# Patient Record
Sex: Female | Born: 1990 | Race: White | Hispanic: No | Marital: Single | Smoking: Former smoker
Health system: Southern US, Community
[De-identification: ages and names within clinical notes are randomized; demographics above are authoritative.]

## PROBLEM LIST (undated history)

## (undated) HISTORY — PX: APPENDECTOMY: SHX54

---

## 2013-12-10 ENCOUNTER — Emergency Department
Admission: EM | Admit: 2013-12-10 | Discharge: 2013-12-10 | Disposition: A | Discharge status: Home / Self Care | Payer: Self-pay | Source: Home / Self Care | Attending: Family Medicine | Admitting: Family Medicine

## 2013-12-10 ENCOUNTER — Emergency Department (INDEPENDENT_AMBULATORY_CARE_PROVIDER_SITE_OTHER): Payer: Self-pay

## 2013-12-10 ENCOUNTER — Encounter: Payer: Self-pay | Admitting: Emergency Medicine

## 2013-12-10 DIAGNOSIS — S335XXA Sprain of ligaments of lumbar spine, initial encounter: Secondary | ICD-10-CM

## 2013-12-10 DIAGNOSIS — S39012A Strain of muscle, fascia and tendon of lower back, initial encounter: Secondary | ICD-10-CM

## 2013-12-10 DIAGNOSIS — M545 Low back pain, unspecified: Secondary | ICD-10-CM

## 2013-12-10 LAB — POCT URINALYSIS DIP (MANUAL ENTRY)
Bilirubin, UA: NEGATIVE
GLUCOSE UA: NEGATIVE
Ketones, POC UA: NEGATIVE
Leukocytes, UA: NEGATIVE
Nitrite, UA: NEGATIVE
Protein Ur, POC: NEGATIVE
SPEC GRAV UA: 1.025 (ref 1.005–1.03)
UROBILINOGEN UA: 0.2 (ref 0–1)
pH, UA: 6 (ref 5–8)

## 2013-12-10 LAB — POCT URINE PREGNANCY: Preg Test, Ur: NEGATIVE

## 2013-12-10 MED ORDER — MELOXICAM 15 MG PO TABS: 15.0000 mg | ORAL_TABLET | Freq: Every day | ORAL | Status: DC

## 2013-12-10 MED ORDER — KETOROLAC TROMETHAMINE 30 MG/ML IJ SOLN
30.0000 mg | Freq: Once | INTRAMUSCULAR | Status: AC
  Administered 2013-12-10: 30 mg via INTRAMUSCULAR

## 2013-12-10 MED ORDER — CYCLOBENZAPRINE HCL 10 MG PO TABS: 10.0000 mg | ORAL_TABLET | Freq: Three times a day (TID) | ORAL | Status: DC | PRN

## 2013-12-10 NOTE — ED Notes (Signed)
Reports onset of left lateral mid thoracic pain x 1 week ago; has started new job in past month that does involve lifting things over her head. Has taken tylenol for discomfort.

## 2013-12-10 NOTE — ED Provider Notes (Addendum)
CSN: [id number]     Arrival date & time 12/10/13  [id number] History   First MD Initiated Contact with Patient 12/10/13 0935     Chief Complaint  Patient presents with  . Back Pain    HPI  The patient presents today with back pain. Location: R lumbar region  Timing: 1 week Description: L lumbar back pain worse with movement. Has some spasms with laying flat. No dysuria, hematuria, increased urinary frequency. 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 Has done more of this over last 1-2 weeks. Pain exacerbated by these movements. On last day on LMP.    Worse with: movement  Better with: rest  Trauma: no Bladder/bowel incontinence: no Weakness: no Fever/chills: no Night pain:no Unexplained weight loss: no Cancer/immunosuppression: no PMH of osteoporosis or chronic steroid use:  no   No past medical history on file. No past surgical history on file. No family history on file. History  Substance Use Topics  . Smoking status: Not on file  . Smokeless tobacco: Not on file  . Alcohol Use: Not on file   OB History   No data available     Review of Systems  All other systems reviewed and are negative.   Allergies  Review of patient's allergies indicates not on file.  Home Medications  No current outpatient prescriptions on file. There were no vitals taken for this visit. Physical Exam  Constitutional: She appears well-developed and well-nourished.  HENT:  Head: Normocephalic and atraumatic.  Eyes: Conjunctivae are normal. Pupils are equal, round, and reactive to light.  Neck: Normal range of motion. Neck supple.  Cardiovascular: Normal rate and regular rhythm.   Pulmonary/Chest: Effort normal and breath sounds normal.  Abdominal: Bowel sounds are normal.  Musculoskeletal:       Arms: + mild TTP over affected area Mild pain with FABER maneuver on L  Neurovascularly intact distally    Neurological: She is alert.  Skin: Skin is warm.    ED Course   Procedures (including critical care time) Labs Review Labs Reviewed - No data to display Imaging Review Dg Lumbar Spine Complete  12/10/2013   CLINICAL DATA:  Low back pain.  EXAM: LUMBAR SPINE - COMPLETE 4+ VIEW  COMPARISON:  None.  FINDINGS: There is no evidence of lumbar spine fracture. Alignment is normal. Intervertebral disc spaces are maintained.  IMPRESSION: Negative.   Electronically Signed   By: Amie Portlandavid  Ormond M.D.   On: 12/10/2013 10:36     MDM   1. Lumbar strain    Toradol 30mg  IMx1 Will place on course of mobic and flexeril  Xrays, UA, upreg WNL  Back pain handout  Discussed MSK red flags  Follow up as needed.     The patient and/or caregiver has been counseled thoroughly with regard to treatment plan and/or medications prescribed including dosage, schedule, interactions, rationale for use, and possible side effects and they verbalize understanding. Diagnoses and expected course of recovery discussed and will return if not improved as expected or if the condition worsens. Patient and/or caregiver verbalized understanding.          Doree AlbeeSteven Newton, MD 12/10/13 1039  Doree AlbeeSteven Newton, MD 12/10/13 1053

## 2014-02-22 ENCOUNTER — Emergency Department
Admission: EM | Admit: 2014-02-22 | Discharge: 2014-02-22 | Disposition: A | Discharge status: Home / Self Care | Payer: Self-pay | Source: Home / Self Care | Attending: Family Medicine | Admitting: Family Medicine

## 2014-02-22 ENCOUNTER — Emergency Department (INDEPENDENT_AMBULATORY_CARE_PROVIDER_SITE_OTHER): Payer: Self-pay

## 2014-02-22 ENCOUNTER — Encounter: Payer: Self-pay | Admitting: Emergency Medicine

## 2014-02-22 DIAGNOSIS — M25529 Pain in unspecified elbow: Secondary | ICD-10-CM

## 2014-02-22 DIAGNOSIS — S50811A Abrasion of right forearm, initial encounter: Secondary | ICD-10-CM

## 2014-02-22 DIAGNOSIS — S5000XA Contusion of unspecified elbow, initial encounter: Secondary | ICD-10-CM

## 2014-02-22 DIAGNOSIS — S5001XA Contusion of right elbow, initial encounter: Secondary | ICD-10-CM

## 2014-02-22 DIAGNOSIS — M25519 Pain in unspecified shoulder: Secondary | ICD-10-CM

## 2014-02-22 DIAGNOSIS — Z23 Encounter for immunization: Secondary | ICD-10-CM

## 2014-02-22 DIAGNOSIS — IMO0002 Reserved for concepts with insufficient information to code with codable children: Secondary | ICD-10-CM

## 2014-02-22 DIAGNOSIS — S43401A Unspecified sprain of right shoulder joint, initial encounter: Secondary | ICD-10-CM

## 2014-02-22 MED ORDER — MELOXICAM 15 MG PO TABS: ORAL_TABLET | ORAL | Status: DC

## 2014-02-22 MED ORDER — TETANUS-DIPHTH-ACELL PERTUSSIS 5-2.5-18.5 LF-MCG/0.5 IM SUSP
0.5000 mL | Freq: Once | INTRAMUSCULAR | Status: AC
  Administered 2014-02-22: 0.5 mL via INTRAMUSCULAR

## 2014-02-22 MED ORDER — CYCLOBENZAPRINE HCL 10 MG PO TABS: ORAL_TABLET | ORAL | Status: DC

## 2014-02-22 NOTE — ED Provider Notes (Signed)
CSN: [id number]     Arrival date & time 02/22/14  [id number] History   First MD Initiated Contact with Patient 02/22/14 [phone]     Chief Complaint  Patient presents with  . Elbow Injury      HPI Comments: Patient fell down stairs this morning, scraping her right forearm just distal to elbow, and injuring her right elbow and shoulder.  She has difficulty raising her right arm, and complains of pain in her right elbow.  Patient is a 23 y.o. female presenting with arm injury. The history is provided by the patient and a parent.  Arm Injury Location:  Elbow and shoulder Time since incident:  3 hours Injury: yes   Mechanism of injury: fall   Fall:    Fall occurred:  Down stairs   Impact surface:  Hard floor   Point of impact: right elbow. Shoulder location:  R shoulder Elbow location:  R elbow Pain details:    Quality:  Aching and sharp   Radiates to:  Does not radiate   Severity:  Moderate   Onset quality:  Sudden   Duration:  3 hours   Timing:  Constant   Progression:  Unchanged Chronicity:  New Handedness:  Right-handed Dislocation: no   Tetanus status:  Out of date Prior injury to area:  No Relieved by:  Nothing Worsened by:  Movement Ineffective treatments:  NSAIDs Associated symptoms: decreased range of motion, stiffness and swelling   Associated symptoms: no back pain, no fatigue, no muscle weakness, no neck pain, no numbness and no tingling     History reviewed. No pertinent past medical history. Past Surgical History  Procedure Laterality Date  . Appendectomy     Family History  Problem Relation Age of Onset  . Hypertension Mother   . Hyperlipidemia Mother    History  Substance Use Topics  . Smoking status: Former Smoker    Types: Cigarettes  . Smokeless tobacco: Not on file  . Alcohol Use: No   OB History   Grav Para Term Preterm Abortions TAB SAB Ect Mult Living                 Review of Systems  Constitutional: Negative for fatigue.  Musculoskeletal:  Positive for stiffness. Negative for back pain and neck pain.  All other systems reviewed and are negative.   Allergies  Review of patient's allergies indicates not on file.  Home Medications   Prior to Admission medications   Medication Sig Start Date End Date Taking? Authorizing Provider  cyclobenzaprine (FLEXERIL) 10 MG tablet Take one tab by mouth at bedtime 02/22/14   Lattie HawStephen A Beese, MD  meloxicam (MOBIC) 15 MG tablet Take one tab by mouth each evening with food 02/22/14   Lattie HawStephen A Beese, MD   BP 108/71  Pulse 63  Temp(Src) 98.1 F (36.7 C) (Oral)  Ht 5\' 2"  (1.575 m)  Wt 155 lb (70.308 kg)  BMI 28.34 kg/m2  SpO2 99%  LMP 01/30/2014 Physical Exam  Nursing note and vitals reviewed. Constitutional: She is oriented to person, place, and time. She appears well-developed and well-nourished. No distress.  HENT:  Head: Normocephalic and atraumatic.  Right Ear: External ear normal.  Left Ear: External ear normal.  Eyes: Conjunctivae are normal. Pupils are equal, round, and reactive to light.  Musculoskeletal:       Right shoulder: She exhibits decreased range of motion, tenderness, bony tenderness, pain and decreased strength. She exhibits no swelling, no effusion, no crepitus, no  deformity, no laceration and normal pulse.       Right elbow: She exhibits decreased range of motion. She exhibits no swelling, no effusion, no deformity and no laceration. Tenderness found. Radial head, medial epicondyle, lateral epicondyle and olecranon process tenderness noted.       Arms: Patient has mild tenderness over right AC joint without swelling.  She cannot actively abduct her right shoulder more than 30 degrees from verical.  Apleys' test positive.  Empty can test positive.  Decreased external rotation right shoulder.  Decreased strength right shoulder to internal/external rotation.  Distal neurovascular function is intact.   Right forearm just distal to the olecranon has a 2cm by 5cm  superficial abrasion  Right elbow has decreased range of motion:  Difficulty fully extending and fully flexing the right elbow.  Diffuse mild tenderness right elbow, especially laterally.  Distal neurovascular function is intact.  Neurological: She is alert and oriented to person, place, and time.  Skin: Skin is warm and dry.    ED Course  Procedures  none     Imaging Review Dg Shoulder Right  02/22/2014   CLINICAL DATA:  Pain.  EXAM: RIGHT SHOULDER - 2+ VIEW  COMPARISON:  None.  FINDINGS: No acute bony or joint abnormality identified. No evidence of fracture or dislocation.  IMPRESSION: No acute abnormality .   Electronically Signed   By: Maisie Fus  Register   On: 02/22/2014 10:19   Dg Elbow Complete Right  02/22/2014   CLINICAL DATA:  Fall.  Pain  EXAM: RIGHT ELBOW - COMPLETE 3+ VIEW  COMPARISON:  None.  FINDINGS: There is no evidence of fracture, dislocation, or joint effusion. There is no evidence of arthropathy or other focal bone abnormality. Soft tissues are unremarkable.  IMPRESSION: Negative.   Electronically Signed   By: Marlan Palau M.D.   On: 02/22/2014 10:18     MDM   1. Contusion of right elbow, initial encounter   2. Abrasion of right forearm, initial encounter   3. Sprain of right shoulder, initial encounter    Tdap administered Abrasion right forearm cleaned; applied bacitracin and Mepelex border dressing. Dispensed  sling. Rx for Mobic and Flexeril.  Wear sling for 5 to 7 days.  Apply ice pack for 20 to 30 minutes, 3 to 4 times daily  Continue until pain and swelling decreases.   Leave bandage in place for 3 days then remove.  Begin range of motion exercises in about 3 to 5 days [employer] information and instruction handout given). Followup with Dr. Rodney [facility] ([facility]) if not improving about one week.    Lattie Haw, MD 02/22/14 [phone]

## 2014-02-22 NOTE — ED Notes (Signed)
Patient fell down stairs this morning and landed on right elbow, Small abrasion, contusion, painful 9/10 with movement.

## 2014-02-22 NOTE — Discharge Instructions (Signed)
Wear sling for 5 to 7 days.  Apply ice pack for 20 to 30 minutes, 3 to 4 times daily  Continue until pain and swelling decreases.   Leave bandage in place for 3 days then remove.  Begin range of motion exercises in about 3 to 5 days.   Abrasion An abrasion is a cut or scrape of the skin. Abrasions do not extend through all layers of the skin and most heal within 10 days. It is important to care for your abrasion properly to prevent infection. CAUSES  Most abrasions are caused by falling on, or gliding across, the ground or other surface. When your skin rubs on something, the outer and inner layer of skin rubs off, causing an abrasion. DIAGNOSIS  Your caregiver will be able to diagnose an abrasion during a physical exam.  TREATMENT  Your treatment depends on how large and deep the abrasion is. Generally, your abrasion will be cleaned with water and a mild soap to remove any dirt or debris. An antibiotic ointment may be put over the abrasion to prevent an infection. A bandage (dressing) may be wrapped around the abrasion to keep it from getting dirty.  You may need a tetanus shot if:  You cannot remember when you had your last tetanus shot.  You have never had a tetanus shot.  The injury broke your skin. If you get a tetanus shot, your arm may swell, get red, and feel warm to the touch. This is common and not a problem. If you need a tetanus shot and you choose not to have one, there is a rare chance of getting tetanus. Sickness from tetanus can be serious.  HOME CARE INSTRUCTIONS   If a dressing was applied, change it at least once a day or as directed by your caregiver. If the bandage sticks, soak it off with warm water.   Wash the area with water and a mild soap to remove all the ointment 2 times a day. Rinse off the soap and pat the area dry with a clean towel.   Reapply any ointment as directed by your caregiver. This will help prevent infection and keep the bandage from sticking. Use  gauze over the wound and under the dressing to help keep the bandage from sticking.   Change your dressing right away if it becomes wet or dirty.   Only take over-the-counter or prescription medicines for pain, discomfort, or fever as directed by your caregiver.   Follow up with your caregiver within 24-48 hours for a wound check, or as directed. If you were not given a wound-check appointment, look closely at your abrasion for redness, swelling, or pus. These are signs of infection. SEEK IMMEDIATE MEDICAL CARE IF:   You have increasing pain in the wound.   You have redness, swelling, or tenderness around the wound.   You have pus coming from the wound.   You have a fever or persistent symptoms for more than 2-3 days.  You have a fever and your symptoms suddenly get worse.  You have a bad smell coming from the wound or dressing.  MAKE SURE YOU:   Understand these instructions.  Will watch your condition.  Will get help right away if you are not doing well or get worse. Document Released: 05/28/2005 Document Revised: 08/04/2012 Document Reviewed: 07/22/2011 [facility] Patient Information 2015 [place], [place]. This information is not intended to replace advice given to you by your health care provider. Make sure you discuss any questions  you have with your health care provider.   Elbow Contusion An elbow contusion is a deep bruise of the elbow. Contusions are the result of an injury that caused bleeding under the skin. The contusion may turn blue, purple, or yellow. Minor injuries will give you a painless contusion, but more severe contusions may stay painful and swollen for a few weeks.  CAUSES  An elbow contusion comes from a direct force to that area, such as falling on the elbow. SYMPTOMS   Swelling and redness of the elbow.  Bruising of the elbow area.  Tenderness or soreness of the elbow. DIAGNOSIS  You will have a physical exam and will be asked about your  history. You may need an X-ray of your elbow to look for a broken bone (fracture).  TREATMENT  A sling or splint may be needed to support your injury. Resting, elevating, and applying cold compresses to the elbow area are often the best treatments for an elbow contusion. Over-the-counter medicines may also be recommended for pain control. HOME CARE INSTRUCTIONS   Put ice on the injured area.  Put ice in a plastic bag.  Place a towel between your skin and the bag.  Leave the ice on for 15-20 minutes, 03-04 times a day.  Only take over-the-counter or prescription medicines for pain, discomfort, or fever as directed by your caregiver.  Rest your injured elbow until the pain and swelling are better.  Elevate your elbow to reduce swelling.  Apply a compression wrap as directed by your caregiver. This can help reduce swelling and motion. You may remove the wrap for sleeping, showers, and baths. If your fingers become numb, cold, or blue, take the wrap off and reapply it more loosely.  Use your elbow only as directed by your caregiver. You may be asked to do range of motion exercises. Do them as directed.  See your caregiver as directed. It is very important to keep all follow-up appointments in order to avoid any long-term problems with your elbow, including chronic pain or inability to move your elbow normally. SEEK IMMEDIATE MEDICAL CARE IF:   You have increased redness, swelling, or pain in your elbow.  Your swelling or pain is not relieved with medicines.  You have swelling of the hand and fingers.  You are unable to move your fingers or wrist.  You begin to lose feeling in your hand or fingers.  Your fingers or hand become cold or blue. MAKE SURE YOU:   Understand these instructions.  Will watch your condition.  Will get help right away if you are not doing well or get worse. Document Released: 07/27/2006 Document Revised: 11/10/2011 Document Reviewed: 07/04/2011 [facility] CenterExitCare  Patient Information 2015 [place], [place]. This information is not intended to replace advice given to you by your health care provider. Make sure you discuss any questions you have with your health care provider.

## 2014-02-24 ENCOUNTER — Telehealth: Payer: Self-pay | Admitting: Emergency Medicine

## 2014-02-24 NOTE — ED Notes (Signed)
Inquired about patient's status; encourage them to call with questions/concerns.  

## 2014-09-04 ENCOUNTER — Encounter: Payer: Self-pay | Admitting: *Deleted

## 2014-09-04 ENCOUNTER — Emergency Department
Admission: EM | Admit: 2014-09-04 | Discharge: 2014-09-04 | Disposition: A | Discharge status: Home / Self Care | Payer: Self-pay | Source: Home / Self Care | Attending: Emergency Medicine | Admitting: Emergency Medicine

## 2014-09-04 DIAGNOSIS — J209 Acute bronchitis, unspecified: Secondary | ICD-10-CM

## 2014-09-04 DIAGNOSIS — J208 Acute bronchitis due to other specified organisms: Secondary | ICD-10-CM

## 2014-09-04 MED ORDER — BENZONATATE 100 MG PO CAPS: 100.0000 mg | ORAL_CAPSULE | Freq: Three times a day (TID) | ORAL | Status: AC

## 2014-09-04 MED ORDER — AZITHROMYCIN 250 MG PO TABS: 250.0000 mg | ORAL_TABLET | Freq: Every day | ORAL | Status: AC

## 2014-09-04 NOTE — Discharge Instructions (Signed)

## 2014-09-04 NOTE — ED Notes (Signed)
Stacey Williams c/o productive cough, congestion, runny nose and chills. Once this Am she had vomiting and diarrhea.

## 2014-09-04 NOTE — ED Provider Notes (Signed)
CSN: [id number]     Arrival date & time 09/04/14  7829 History   First MD Initiated Contact with Patient 09/04/14 [phone]     Chief Complaint  Patient presents with  . Cough  . Nasal Congestion   (Consider location/radiation/quality/duration/timing/severity/associated sxs/prior Treatment) Patient is a 24 y.o. female presenting with cough. The history is provided by the patient. No language interpreter was used.  Cough Cough characteristics:  Productive Sputum characteristics:  Nondescript Severity:  Moderate Onset quality:  Gradual Duration:  3 days Timing:  Constant Progression:  Worsening Chronicity:  New Smoker: no   Relieved by:  Nothing Worsened by:  Nothing tried Ineffective treatments:  None tried Associated symptoms: myalgias and sinus congestion     History reviewed. No pertinent past medical history. Past Surgical History  Procedure Laterality Date  . Appendectomy     Family History  Problem Relation Age of Onset  . Hypertension Mother   . Hyperlipidemia Mother    History  Substance Use Topics  . Smoking status: Former Smoker    Types: Cigarettes  . Smokeless tobacco: Never Used  . Alcohol Use: No   OB History    No data available     Review of Systems  Respiratory: Positive for cough.   Musculoskeletal: Positive for myalgias.  All other systems reviewed and are negative.   Allergies  Review of patient's allergies indicates no known allergies.  Home Medications   Prior to Admission medications   Medication Sig Start Date End Date Taking? Authorizing Provider  azithromycin (ZITHROMAX) 250 MG tablet Take 1 tablet (250 mg total) by mouth daily. Take first 2 tablets together, then 1 every day until finished. 09/04/14   Elson Areas, PA-C  benzonatate (TESSALON) 100 MG capsule Take 1 capsule (100 mg total) by mouth every 8 (eight) hours. 09/04/14   Elson Areas, PA-C   BP 128/87 mmHg  Pulse 90  Temp(Src) 98.4 F (36.9 C) (Oral)  Resp 14  Wt 157 lb  (71.215 kg)  SpO2 97%  LMP 09/01/2014 Physical Exam  Constitutional: She is oriented to person, place, and time. She appears well-developed and well-nourished.  HENT:  Head: Normocephalic and atraumatic.  Right Ear: External ear normal.  Left Ear: External ear normal.  Nose: Nose normal.  Mouth/Throat: Oropharynx is clear and moist.  Eyes: Conjunctivae and EOM are normal. Pupils are equal, round, and reactive to light.  Neck: Normal range of motion.  Cardiovascular: Normal rate and normal heart sounds.   Pulmonary/Chest: Effort normal and breath sounds normal.  Abdominal: She exhibits no distension.  Musculoskeletal: Normal range of motion.  Neurological: She is alert and oriented to person, place, and time.  Psychiatric: She has a normal mood and affect.  Nursing note and vitals reviewed.   ED Course  Procedures (including critical care time) Labs Review Labs Reviewed - No data to display  Imaging Review No results found.   MDM   1. Acute bronchitis due to other specified organisms    zithromax Tessalon perles Recheck in 1 week if not improved AVS   Elson Areas, PA-C 09/04/14 1012

## 2014-12-23 IMAGING — CR DG ELBOW COMPLETE 3+V*R*
4 series · 4 of 4 positions shown · non-contrast
Comparison: None.

CLINICAL DATA: Fall.  Pain

EXAM:
RIGHT ELBOW - COMPLETE 3+ VIEW

[view not recorded (1 of 4)]
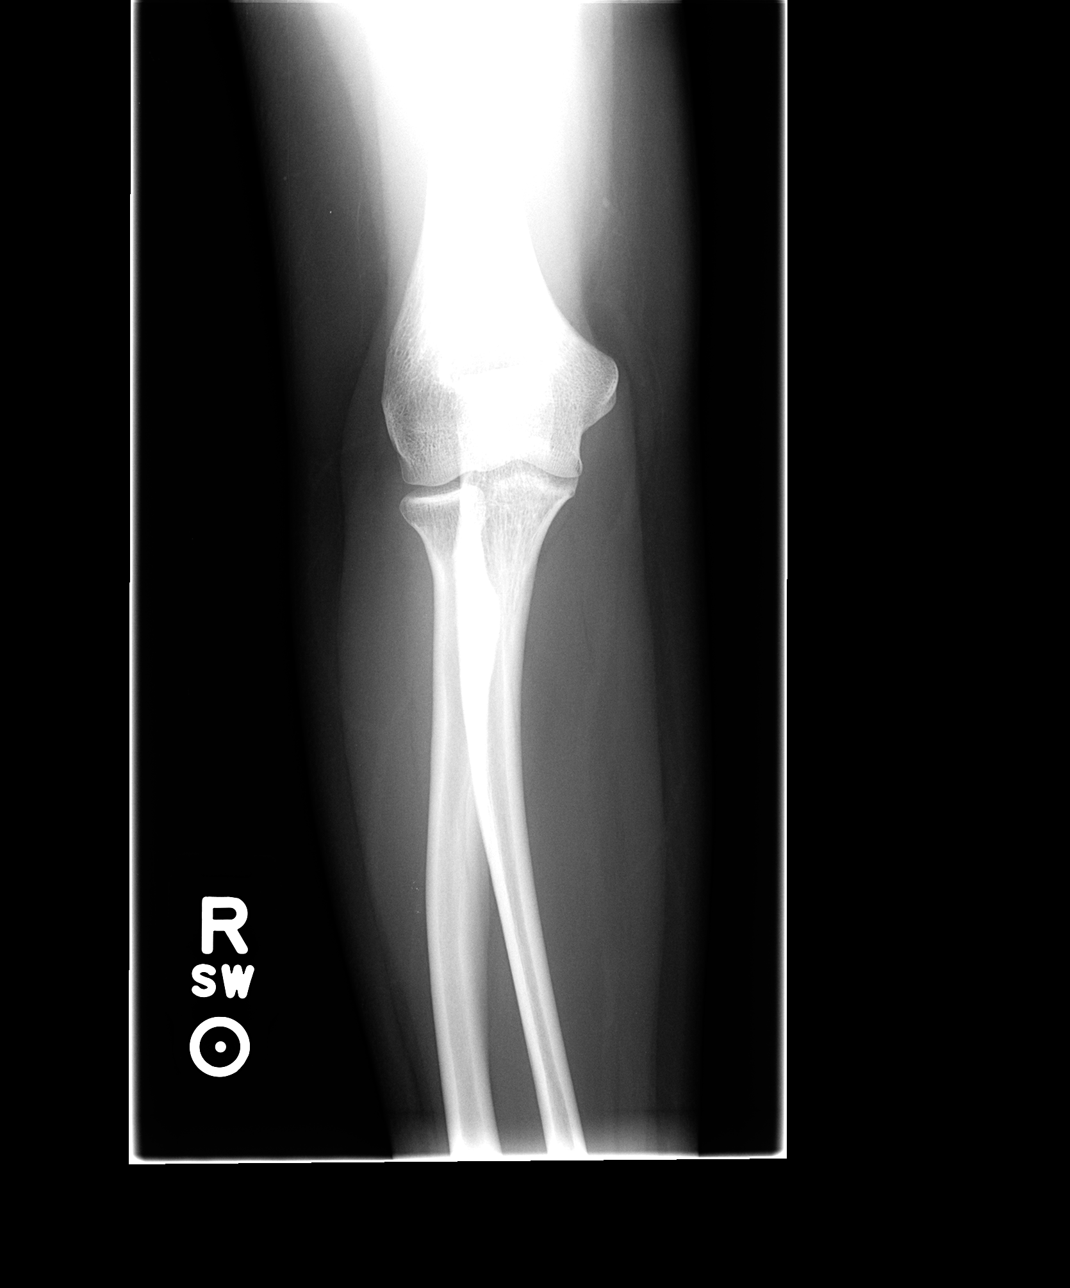

[view not recorded (2 of 4)]
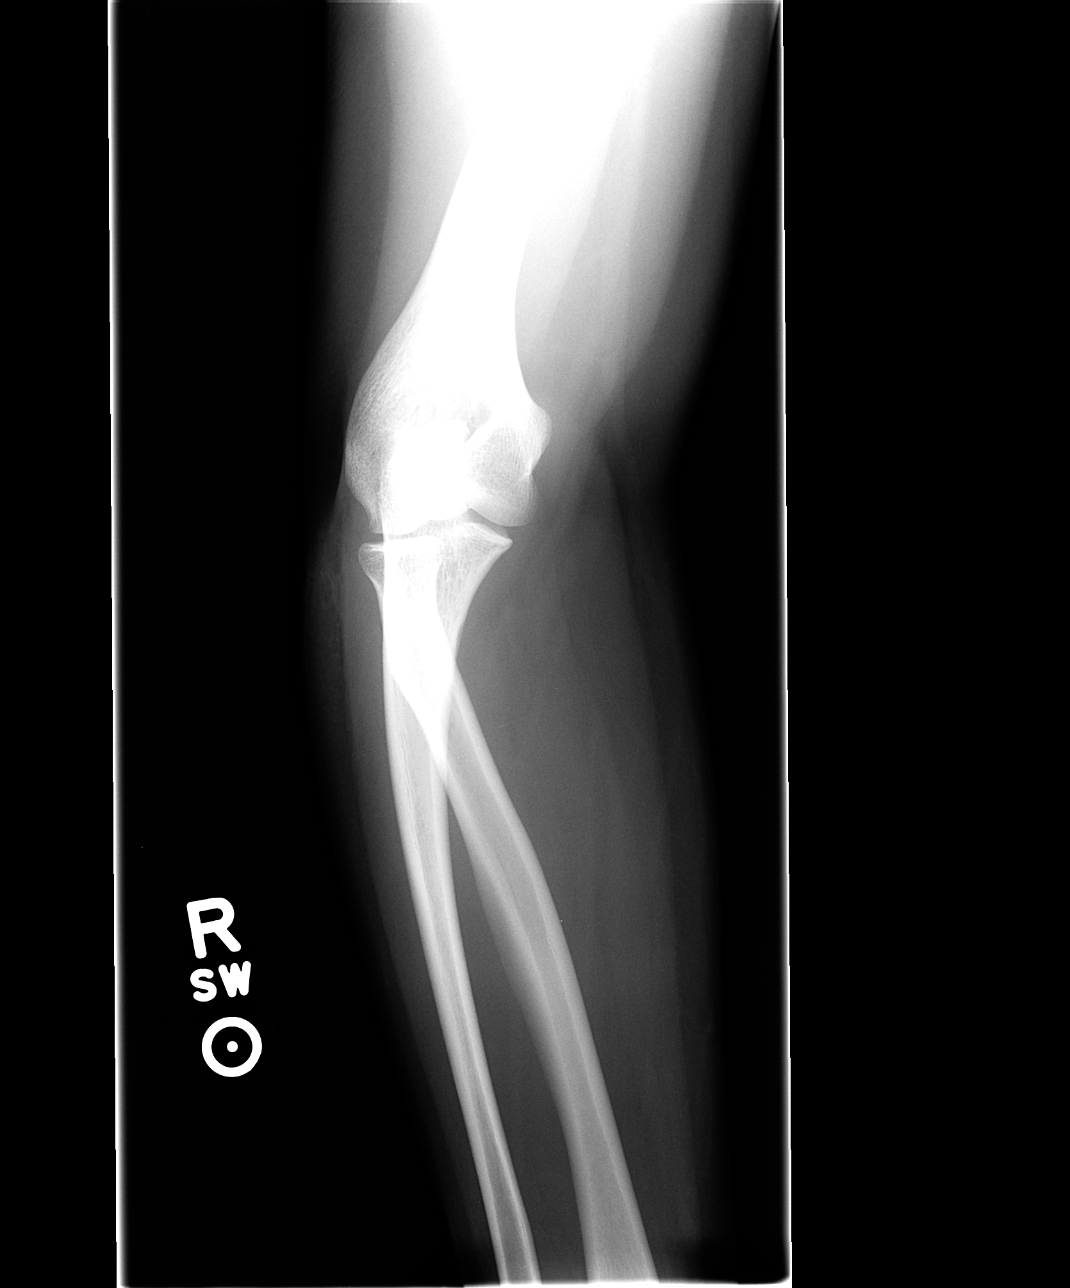

[view not recorded (3 of 4)]
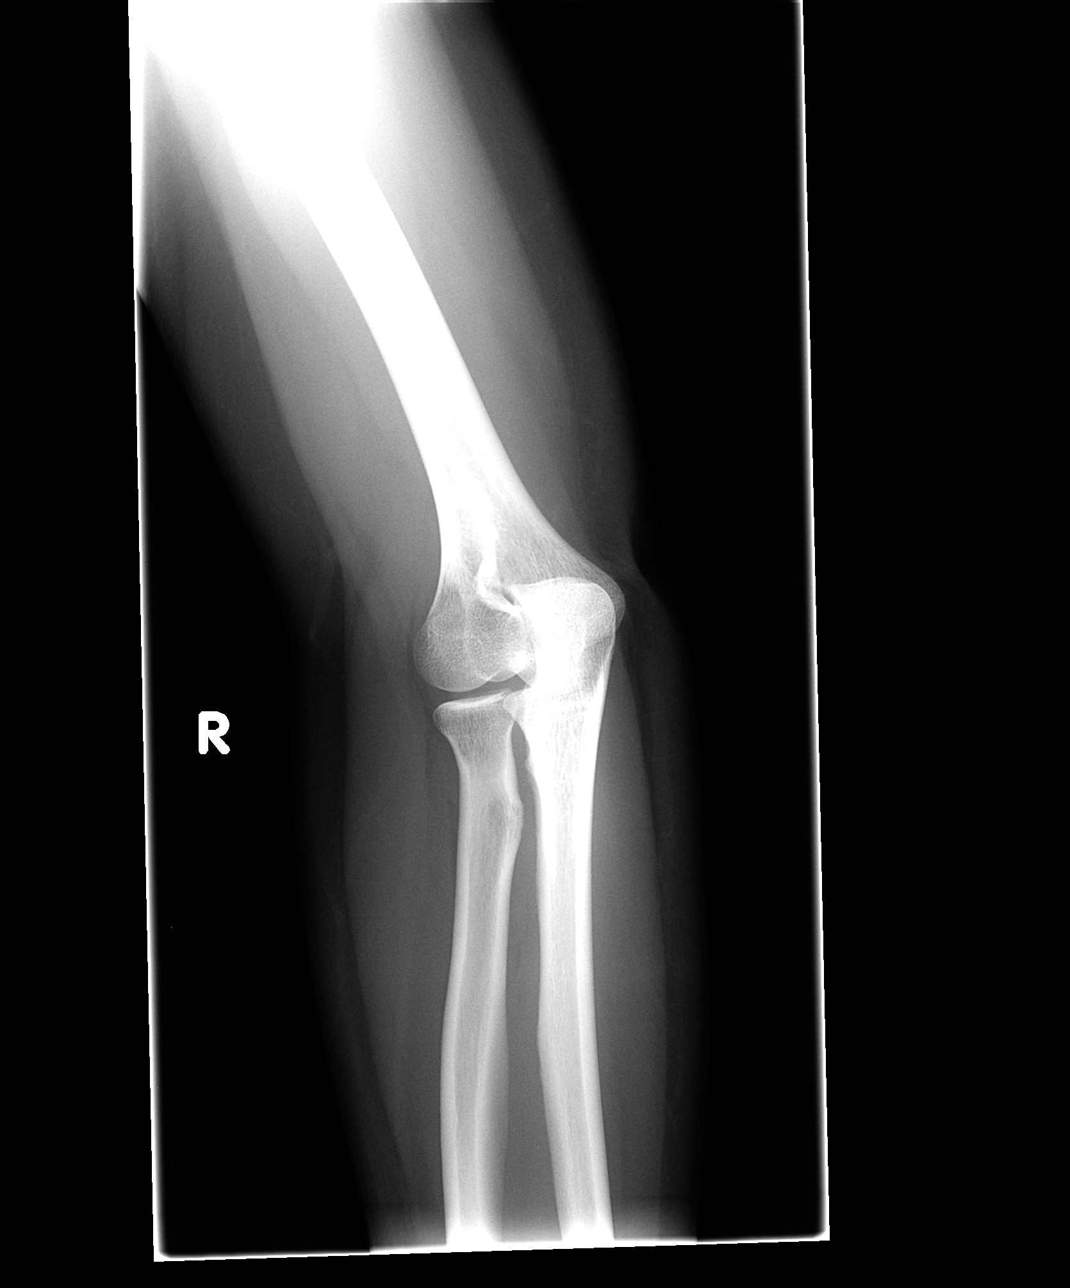

[view not recorded (4 of 4)]
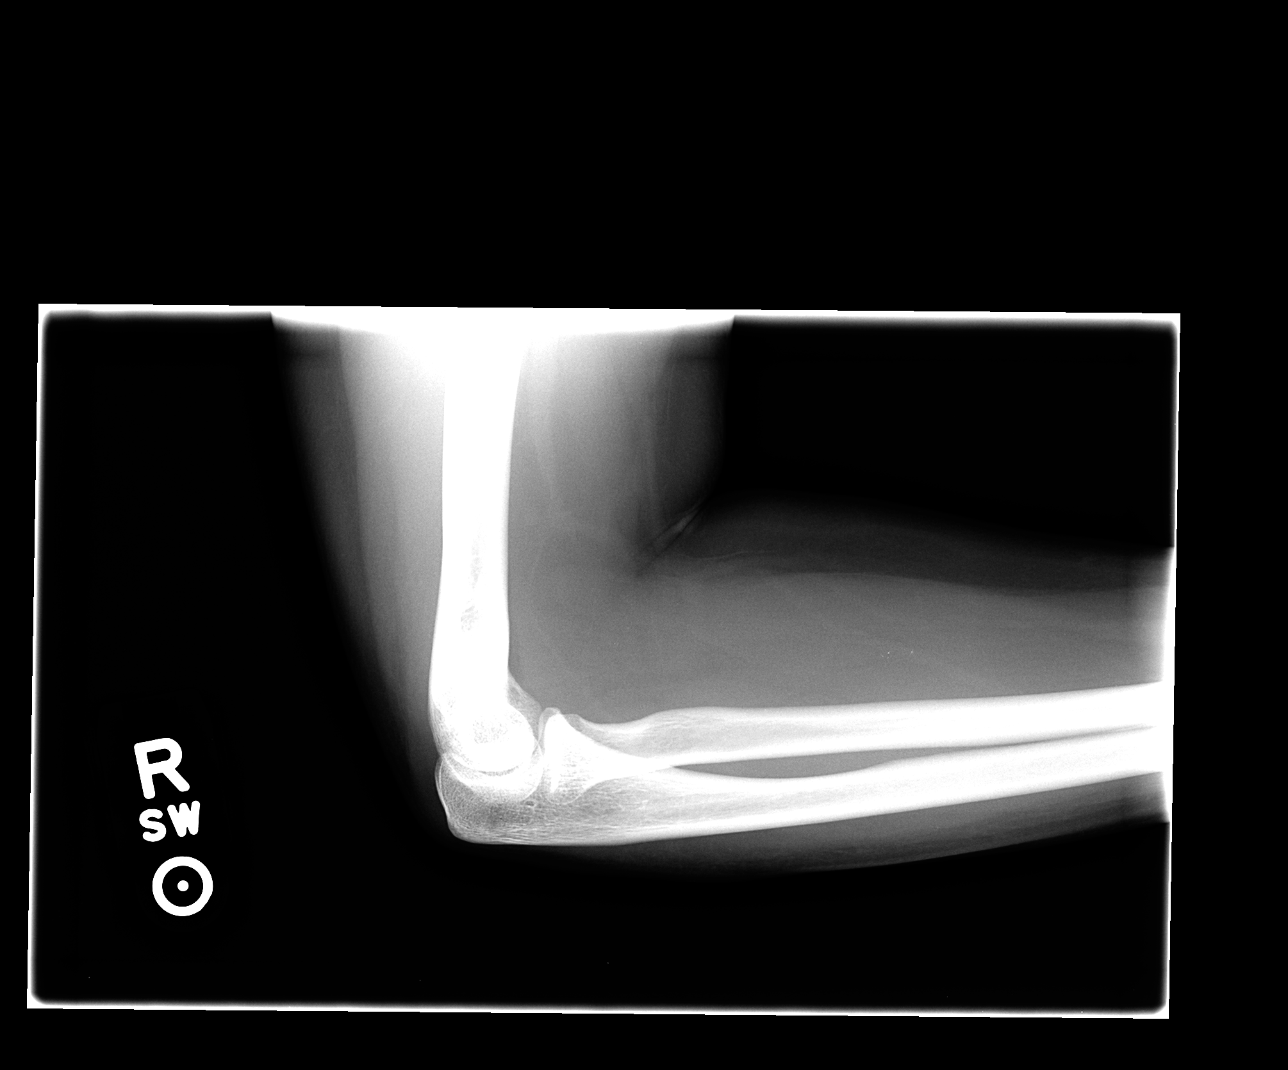

[4 of 4 positions shown; findings below may reference images not displayed]

FINDINGS: There is no evidence of fracture, dislocation, or joint effusion.
There is no evidence of arthropathy or other focal bone abnormality.
Soft tissues are unremarkable.
IMPRESSION: Negative.

## 2014-12-23 IMAGING — CR DG SHOULDER 2+V*R*
3 series · 3 of 3 positions shown · non-contrast
Comparison: None.

CLINICAL DATA: Pain.

EXAM:
RIGHT SHOULDER - 2+ VIEW

[view not recorded (1 of 3)]
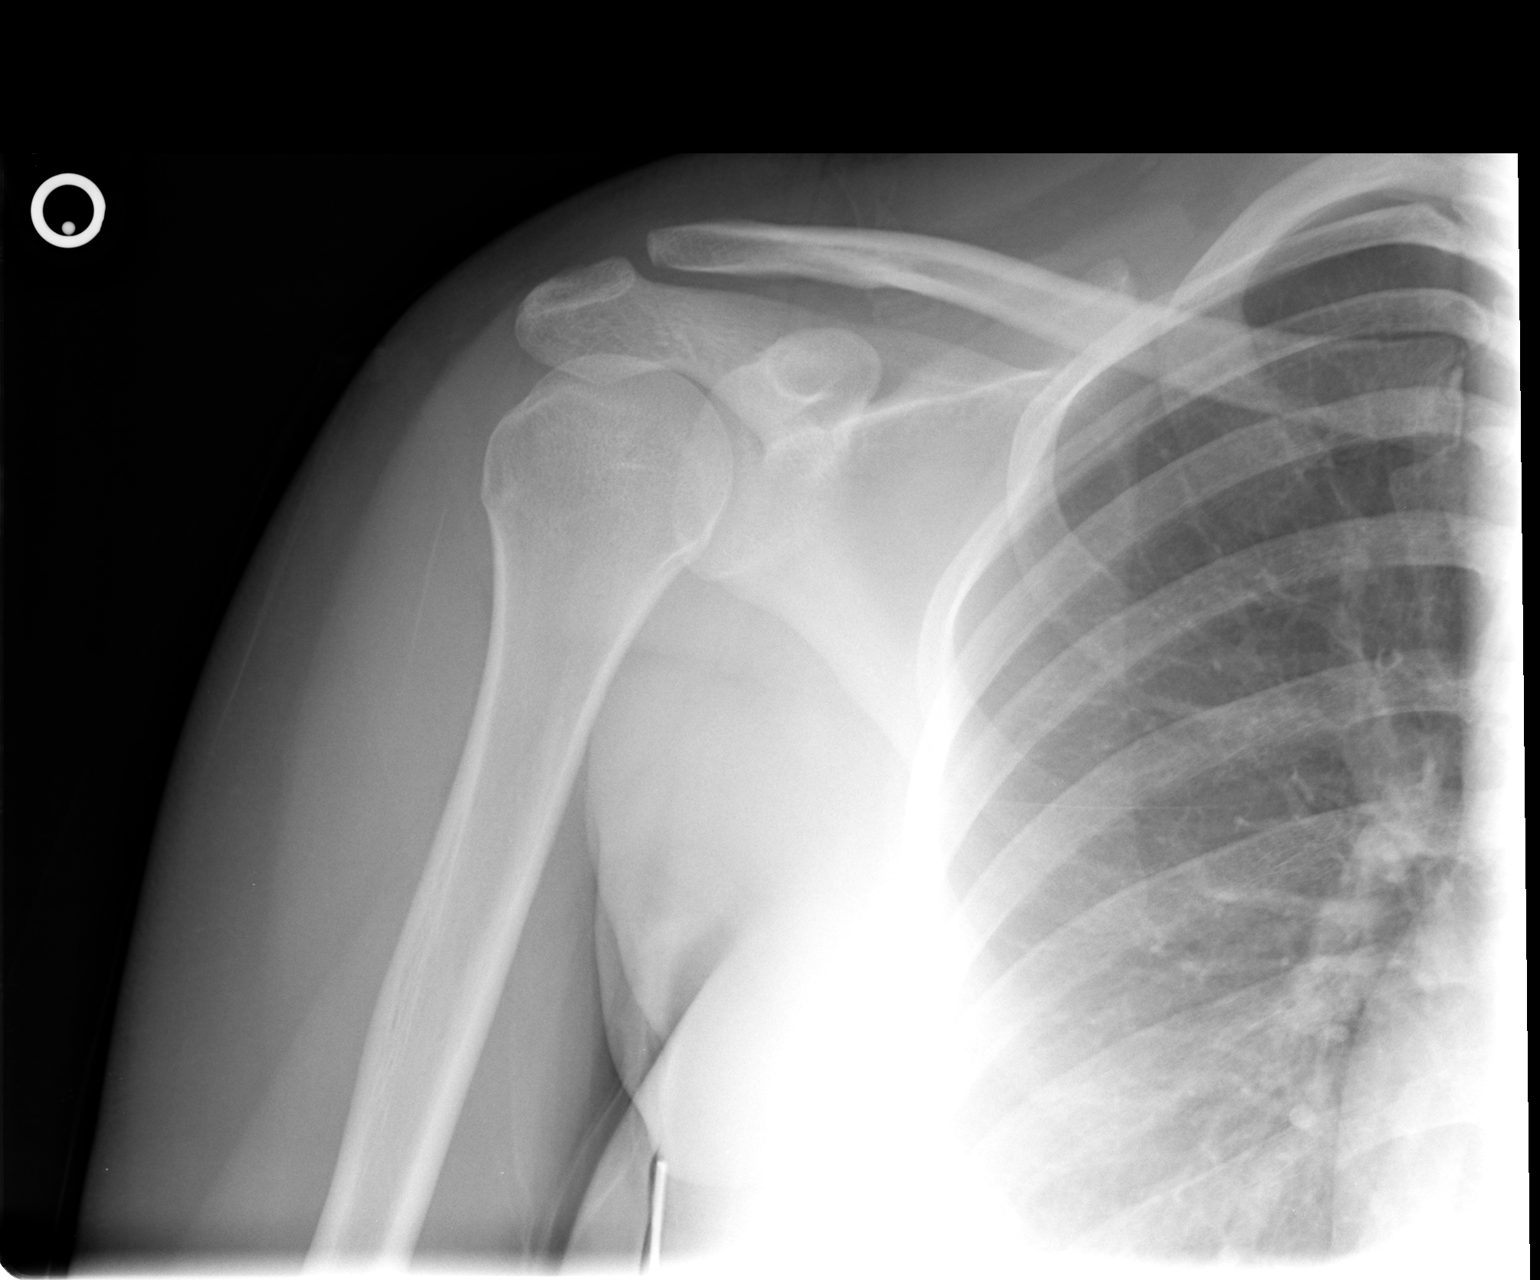

[view not recorded (2 of 3)]
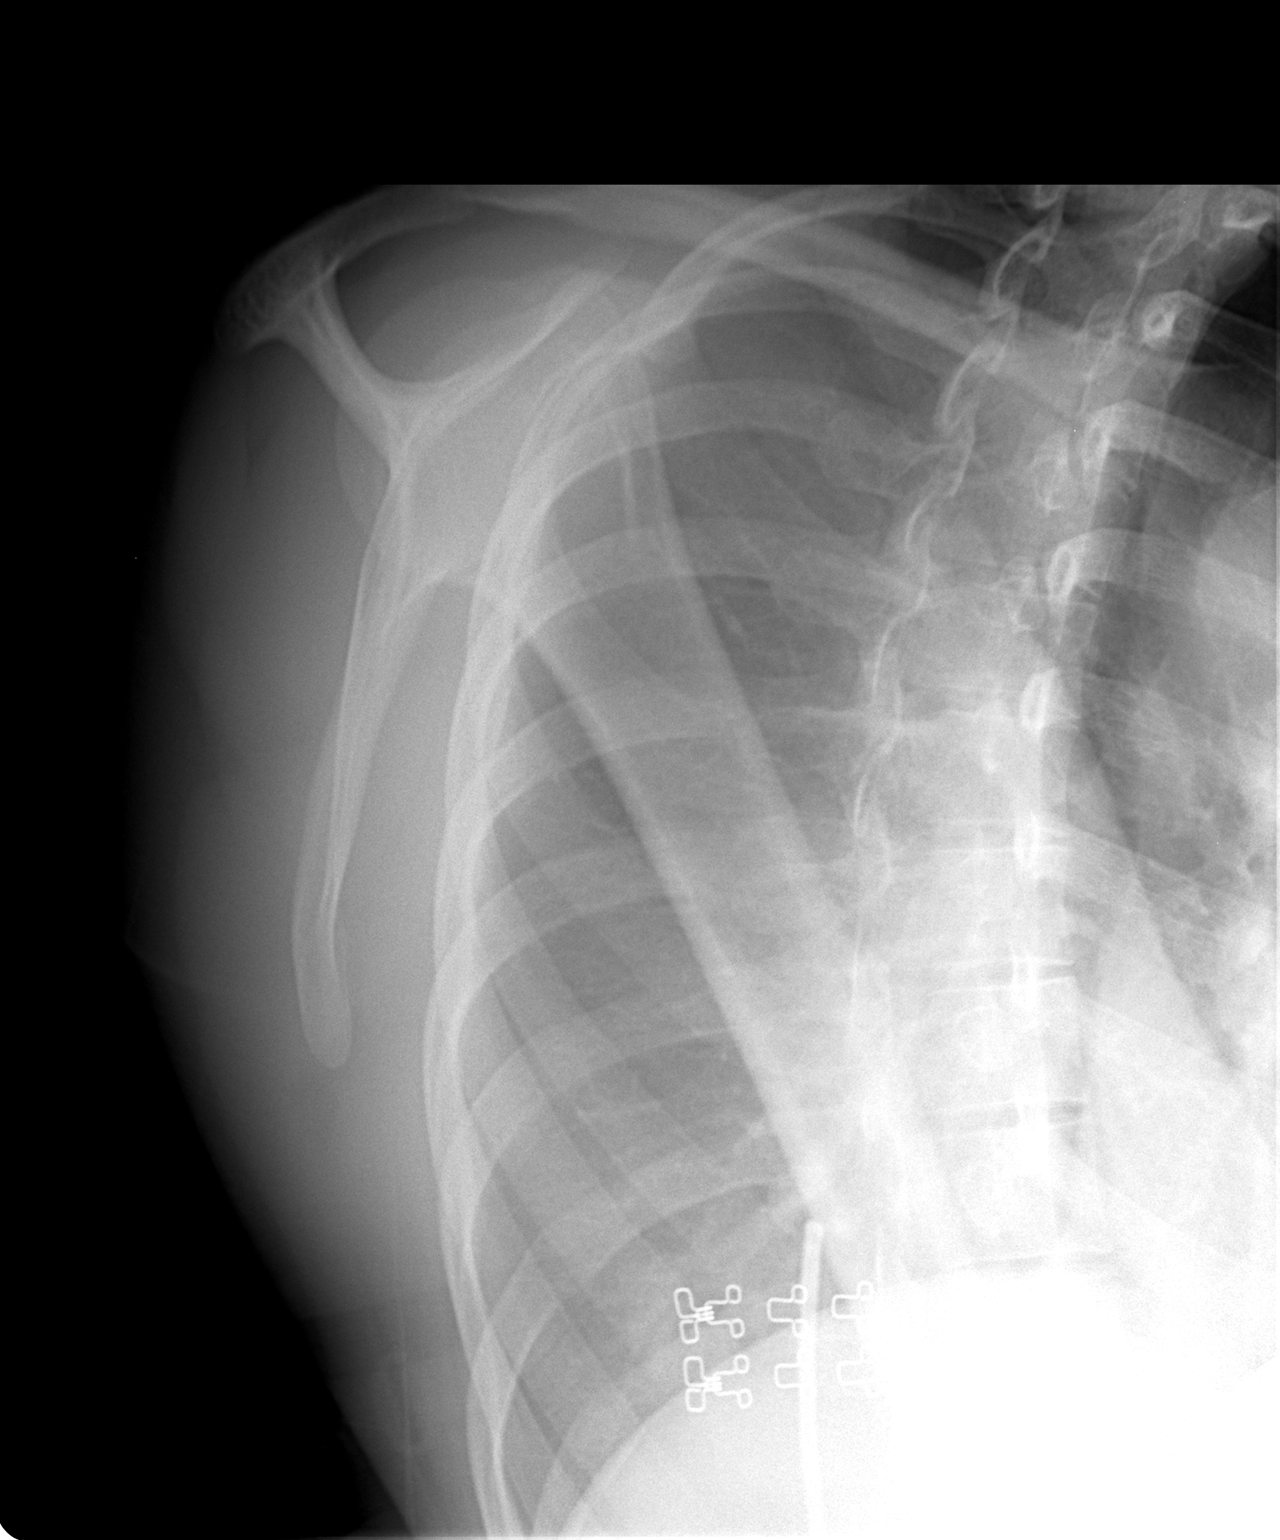

[view not recorded (3 of 3)]
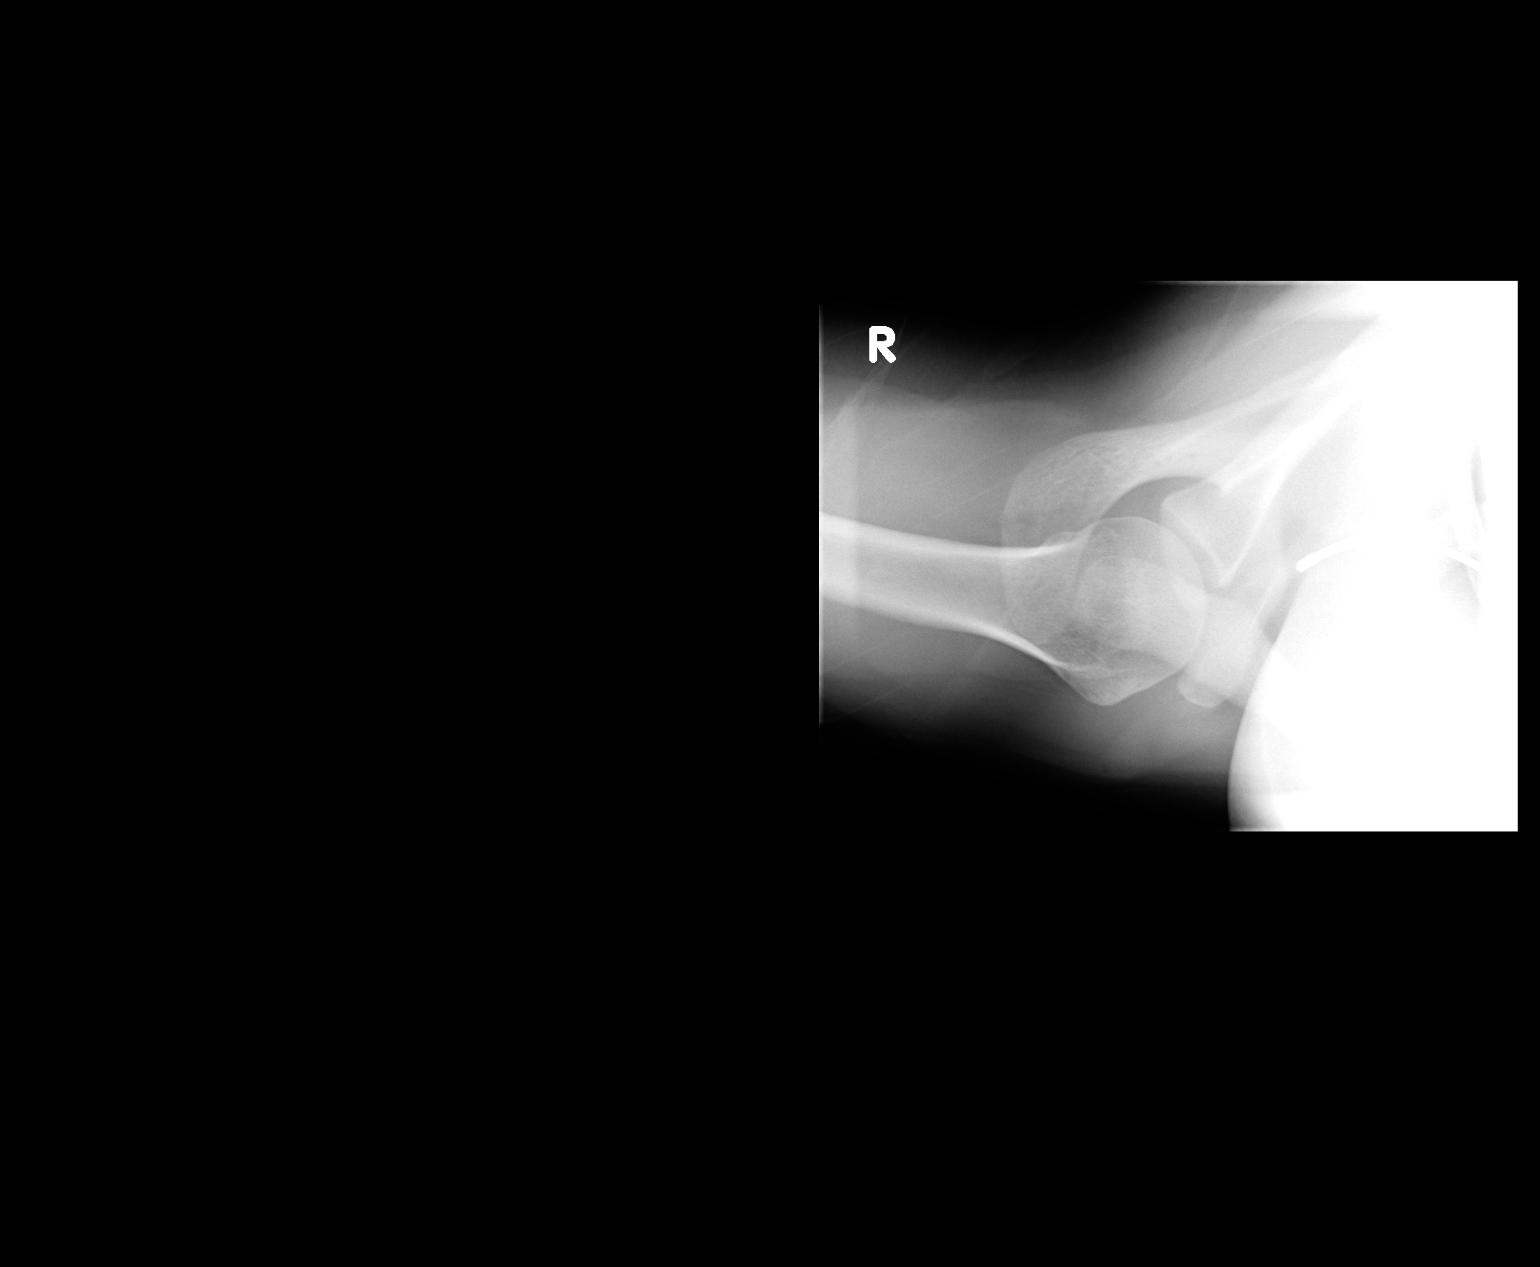

[3 of 3 positions shown; findings below may reference images not displayed]

FINDINGS: No acute bony or joint abnormality identified. No evidence of
fracture or dislocation.
IMPRESSION: No acute abnormality .

## 2015-08-02 DEATH — deceased
